# Patient Record
Sex: Male | Born: 1955 | Race: White | Hispanic: No | Marital: Married | Smoking: Never smoker
Health system: Southern US, Community
[De-identification: ages and names within clinical notes are randomized; demographics above are authoritative.]

## PROBLEM LIST (undated history)

## (undated) DIAGNOSIS — S060XAA Concussion with loss of consciousness status unknown, initial encounter: Secondary | ICD-10-CM

## (undated) DIAGNOSIS — E7800 Pure hypercholesterolemia, unspecified: Secondary | ICD-10-CM

## (undated) DIAGNOSIS — I10 Essential (primary) hypertension: Secondary | ICD-10-CM

## (undated) DIAGNOSIS — S060X9A Concussion with loss of consciousness of unspecified duration, initial encounter: Secondary | ICD-10-CM

## (undated) HISTORY — PX: TOOTH EXTRACTION: SUR596

## (undated) HISTORY — PX: TESTICLE TORSION REDUCTION: SHX795

---

## 1999-09-22 ENCOUNTER — Emergency Department (HOSPITAL_COMMUNITY): Admission: EM | Admit: 1999-09-22 | Discharge: 1999-09-22 | Payer: Self-pay

## 2002-09-14 ENCOUNTER — Emergency Department (HOSPITAL_COMMUNITY): Admission: EM | Admit: 2002-09-14 | Discharge: 2002-09-14 | Payer: Self-pay | Admitting: Emergency Medicine

## 2002-09-14 ENCOUNTER — Encounter: Payer: Self-pay | Admitting: Emergency Medicine

## 2002-09-15 ENCOUNTER — Encounter: Payer: Self-pay | Admitting: *Deleted

## 2002-09-15 ENCOUNTER — Emergency Department (HOSPITAL_COMMUNITY): Admission: EM | Admit: 2002-09-15 | Discharge: 2002-09-15 | Payer: Self-pay | Admitting: *Deleted

## 2016-03-07 ENCOUNTER — Encounter (HOSPITAL_COMMUNITY): Payer: Self-pay

## 2016-03-07 ENCOUNTER — Emergency Department (HOSPITAL_COMMUNITY)
Admission: EM | Admit: 2016-03-07 | Discharge: 2016-03-07 | Disposition: A | Discharge status: Home / Self Care | Payer: BLUE CROSS/BLUE SHIELD | Attending: Emergency Medicine | Admitting: Emergency Medicine

## 2016-03-07 ENCOUNTER — Emergency Department (HOSPITAL_COMMUNITY): Payer: BLUE CROSS/BLUE SHIELD

## 2016-03-07 DIAGNOSIS — N289 Disorder of kidney and ureter, unspecified: Secondary | ICD-10-CM | POA: Diagnosis not present

## 2016-03-07 DIAGNOSIS — N132 Hydronephrosis with renal and ureteral calculous obstruction: Secondary | ICD-10-CM | POA: Diagnosis not present

## 2016-03-07 DIAGNOSIS — N23 Unspecified renal colic: Secondary | ICD-10-CM

## 2016-03-07 DIAGNOSIS — Z79899 Other long term (current) drug therapy: Secondary | ICD-10-CM | POA: Insufficient documentation

## 2016-03-07 DIAGNOSIS — I10 Essential (primary) hypertension: Secondary | ICD-10-CM | POA: Diagnosis not present

## 2016-03-07 DIAGNOSIS — R319 Hematuria, unspecified: Secondary | ICD-10-CM | POA: Diagnosis present

## 2016-03-07 HISTORY — DX: Pure hypercholesterolemia, unspecified: E78.00

## 2016-03-07 HISTORY — DX: Essential (primary) hypertension: I10

## 2016-03-07 HISTORY — DX: Concussion with loss of consciousness status unknown, initial encounter: S06.0XAA

## 2016-03-07 HISTORY — DX: Concussion with loss of consciousness of unspecified duration, initial encounter: S06.0X9A

## 2016-03-07 LAB — COMPREHENSIVE METABOLIC PANEL
ALT: 36 U/L (ref 17–63)
ANION GAP: 10 (ref 5–15)
AST: 25 U/L (ref 15–41)
Albumin: 4 g/dL (ref 3.5–5.0)
Alkaline Phosphatase: 55 U/L (ref 38–126)
BUN: 17 mg/dL (ref 6–20)
CHLORIDE: 104 mmol/L (ref 101–111)
CO2: 24 mmol/L (ref 22–32)
Calcium: 9.3 mg/dL (ref 8.9–10.3)
Creatinine, Ser: 1.32 mg/dL — ABNORMAL HIGH (ref 0.61–1.24)
GFR, EST NON AFRICAN AMERICAN: 56 mL/min — AB (ref >60)
Glucose, Bld: 121 mg/dL — ABNORMAL HIGH (ref 65–99)
POTASSIUM: 3.9 mmol/L (ref 3.5–5.1)
SODIUM: 138 mmol/L (ref 135–145)
Total Bilirubin: 0.6 mg/dL (ref 0.3–1.2)
Total Protein: 6.4 g/dL — ABNORMAL LOW (ref 6.5–8.1)

## 2016-03-07 LAB — URINE MICROSCOPIC-ADD ON

## 2016-03-07 LAB — URINALYSIS, ROUTINE W REFLEX MICROSCOPIC
GLUCOSE, UA: NEGATIVE mg/dL
Ketones, ur: 15 mg/dL — AB
Nitrite: NEGATIVE
PROTEIN: 30 mg/dL — AB
SPECIFIC GRAVITY, URINE: 1.019 (ref 1.005–1.030)
pH: 7 (ref 5.0–8.0)

## 2016-03-07 LAB — CBC
HCT: 44.5 % (ref 39.0–52.0)
HEMOGLOBIN: 14.8 g/dL (ref 13.0–17.0)
MCH: 29.4 pg (ref 26.0–34.0)
MCHC: 33.3 g/dL (ref 30.0–36.0)
MCV: 88.5 fL (ref 78.0–100.0)
Platelets: 199 10*3/uL (ref 150–400)
RBC: 5.03 MIL/uL (ref 4.22–5.81)
RDW: 13.2 % (ref 11.5–15.5)
WBC: 8.1 10*3/uL (ref 4.0–10.5)

## 2016-03-07 LAB — POC OCCULT BLOOD, ED: FECAL OCCULT BLD: NEGATIVE

## 2016-03-07 LAB — LIPASE, BLOOD: Lipase: 26 U/L (ref 11–51)

## 2016-03-07 MED ORDER — HYDROMORPHONE HCL 1 MG/ML IJ SOLN
1.0000 mg | Freq: Once | INTRAMUSCULAR | Status: AC
  Administered 2016-03-07: 1 mg via INTRAVENOUS
  Filled 2016-03-07: qty 1

## 2016-03-07 MED ORDER — IOPAMIDOL (ISOVUE-300) INJECTION 61%
INTRAVENOUS | Status: AC
  Filled 2016-03-07: qty 100

## 2016-03-07 MED ORDER — TAMSULOSIN HCL 0.4 MG PO CAPS: 0.4000 mg | ORAL_CAPSULE | Freq: Every day | ORAL | 0 refills | Status: AC

## 2016-03-07 MED ORDER — IBUPROFEN 800 MG PO TABS: 800.0000 mg | ORAL_TABLET | Freq: Three times a day (TID) | ORAL | 0 refills | Status: AC

## 2016-03-07 MED ORDER — IOPAMIDOL (ISOVUE-300) INJECTION 61%
100.0000 mL | Freq: Once | INTRAVENOUS | Status: AC | PRN
  Administered 2016-03-07: 100 mL via INTRAVENOUS

## 2016-03-07 MED ORDER — OXYCODONE-ACETAMINOPHEN 5-325 MG PO TABS: 1.0000 | ORAL_TABLET | Freq: Four times a day (QID) | ORAL | 0 refills | Status: AC | PRN

## 2016-03-07 MED ORDER — KETOROLAC TROMETHAMINE 30 MG/ML IJ SOLN
30.0000 mg | Freq: Once | INTRAMUSCULAR | Status: AC
  Administered 2016-03-07: 30 mg via INTRAVENOUS
  Filled 2016-03-07: qty 1

## 2016-03-07 NOTE — ED Triage Notes (Signed)
Per Pt, Pt reports low left back pain since Sunday with abdominal pain, nausea, and constipation. Today, urine was noted to be blood tinged around 1130. Attempted heating pad, back massage, and home remedies to help with constipation. Pt felt relief from pain, but no bowel movement.

## 2016-03-07 NOTE — ED Notes (Signed)
Patient transported to CT 

## 2016-03-07 NOTE — Discharge Instructions (Signed)
Call [facility] tomorrow to schedule the next available appointment. Strain all urine if you catch a stone save it and given to the urologist. If your pain recurs, take the pain medicine as prescribed. If your pain is not well controlled with the pain medicine prescribed, please [facility].

## 2016-03-07 NOTE — ED Notes (Signed)
MD at bedside. 

## 2016-03-07 NOTE — ED Notes (Signed)
Occult stool card placed at bedside for MD.

## 2016-03-07 NOTE — ED Provider Notes (Signed)
[facility]-EMERGENCY DEPT Provider Note   CSN: [id number] Arrival date & time: 03/07/16  1309  First Provider Contact:  First MD Initiated Contact with Patient 03/07/16 1457        History   Chief Complaint Chief Complaint  Patient presents with  . Hematuria    HPI Neldon NewportLeonard Dauria is a 60 y.o. male.Complains of diffuse crampy abdominal pain onset 3 days ago pain is alleviated with passing gas and with belching. Today he developed left flank pain and hematuria. Nothing makes symptoms better or worse. Presently pain is almost gone. Patient feels constipated. His last good bowel movement was 4 days ago off however he's had several small bowel movements since. No other associated symptoms no treatment prior to coming here. Nothing makes symptoms better or worse. He treated himself with ibuprofen 2 days ago with partial relief  HPI  Past Medical History:  Diagnosis Date  . Concussion   . High cholesterol   . Hypertension     There are no active problems to display for this patient.   Past Surgical History:  Procedure Laterality Date  . TESTICLE TORSION REDUCTION    . TOOTH EXTRACTION         Home Medications    Prior to Admission medications   Medication Sig Start Date End Date Taking? Authorizing Provider  Coenzyme Q10 (CO Q 10 PO) Take 1 tablet by mouth daily.   Yes Historical Provider, MD  lisinopril (PRINIVIL,ZESTRIL) 10 MG tablet Take 10 mg by mouth every evening.    Yes Historical Provider, MD  Multiple Vitamin (MULTI-VITAMIN PO) Take 1 tablet by mouth daily.   Yes Historical Provider, MD  omega-3 acid ethyl esters (LOVAZA) 1 g capsule Take by mouth 2 (two) times daily.   Yes Historical Provider, MD  pravastatin (PRAVACHOL) 40 MG tablet Take 40 mg by mouth every evening.    Yes Historical Provider, MD  TURMERIC PO Take 1 tablet by mouth daily.   Yes Historical Provider, MD    Family History No family history on file.  Social History Social History  Substance Use  Topics  . Smoking status: Never Smoker  . Smokeless tobacco: Never Used  . Alcohol use Yes     Comment: Occasional   Rare alcohol no illicit drug use  Allergies   Review of patient's allergies indicates no known allergies.   Review of Systems Review of Systems  Constitutional: Negative.   HENT: Negative.   Respiratory: Negative.   Cardiovascular: Negative.   Gastrointestinal: Positive for abdominal pain and constipation.  Genitourinary: Positive for hematuria.  Musculoskeletal: Negative.   Skin: Negative.   Neurological: Negative.   Psychiatric/Behavioral: Negative.   All other systems reviewed and are negative.    Physical Exam Updated Vital Signs BP 131/92 (BP Location: Right Arm)   Pulse 90   Temp 98.9 F (37.2 C) (Oral)   Resp 20   Ht 6' (1.829 m)   Wt 275 lb (124.7 kg)   SpO2 97%   BMI 37.30 kg/m   Physical Exam  Constitutional: He appears well-developed and well-nourished.  HENT:  Head: Normocephalic and atraumatic.  Eyes: Conjunctivae are normal. Pupils are equal, round, and reactive to light.  Neck: Neck supple. No tracheal deviation present. No thyromegaly present.  Cardiovascular: Normal rate and regular rhythm.   No murmur heard. Pulmonary/Chest: Effort normal and breath sounds normal.  Abdominal: Soft. Bowel sounds are normal. He exhibits no distension. There is no tenderness.  Obese  Genitourinary: Penis normal.  Genitourinary Comments: Scrotum normal  Musculoskeletal: Normal range of motion. He exhibits no edema or tenderness.  Neurological: He is alert. Coordination normal.  Skin: Skin is warm and dry. No rash noted.  Psychiatric: He has a normal mood and affect.  Nursing note and vitals reviewed.    ED Treatments / Results  Labs (all labs ordered are listed, but only abnormal results are displayed) Labs Reviewed  COMPREHENSIVE METABOLIC PANEL - Abnormal; Notable for the following:       Result Value   Glucose, Bld 121 (*)     Creatinine, Ser 1.32 (*)    Total Protein 6.4 (*)    GFR calc non Af Amer 56 (*)    All other components within normal limits  URINALYSIS, ROUTINE W REFLEX MICROSCOPIC (NOT AT [facility]) - Abnormal; Notable for the following:    Color, Urine RED (*)    APPearance TURBID (*)    Hgb urine dipstick LARGE (*)    Bilirubin Urine MODERATE (*)    Ketones, ur 15 (*)    Protein, ur 30 (*)    Leukocytes, UA SMALL (*)    All other components within normal limits  URINE MICROSCOPIC-ADD ON - Abnormal; Notable for the following:    Squamous Epithelial / LPF 0-5 (*)    Bacteria, UA FEW (*)    All other components within normal limits  LIPASE, BLOOD  CBC  POC OCCULT BLOOD, ED   Results for orders placed or performed during the hospital encounter of 03/07/16  Lipase, blood  Result Value Ref Range   Lipase 26 11 - 51 U/L  Comprehensive metabolic panel  Result Value Ref Range   Sodium 138 135 - 145 mmol/L   Potassium 3.9 3.5 - 5.1 mmol/L   Chloride 104 101 - 111 mmol/L   CO2 24 22 - 32 mmol/L   Glucose, Bld 121 (H) 65 - 99 mg/dL   BUN 17 6 - 20 mg/dL   Creatinine, Ser 1.61 (H) 0.61 - 1.24 mg/dL   Calcium 9.3 8.9 - 09.6 mg/dL   Total Protein 6.4 (L) 6.5 - 8.1 g/dL   Albumin 4.0 3.5 - 5.0 g/dL   AST 25 15 - 41 U/L   ALT 36 17 - 63 U/L   Alkaline Phosphatase 55 38 - 126 U/L   Total Bilirubin 0.6 0.3 - 1.2 mg/dL   GFR calc non Af Amer 56 (L) >60 mL/min   GFR calc Af Amer >60 >60 mL/min   Anion gap 10 5 - 15  CBC  Result Value Ref Range   WBC 8.1 4.0 - 10.5 K/uL   RBC 5.03 4.22 - 5.81 MIL/uL   Hemoglobin 14.8 13.0 - 17.0 g/dL   HCT 04.5 40.9 - 81.1 %   MCV 88.5 78.0 - 100.0 fL   MCH 29.4 26.0 - 34.0 pg   MCHC 33.3 30.0 - 36.0 g/dL   RDW 91.4 78.2 - 95.6 %   Platelets 199 150 - 400 K/uL  Urinalysis, Routine w reflex microscopic  Result Value Ref Range   Color, Urine RED (A) YELLOW   APPearance TURBID (A) CLEAR   Specific Gravity, Urine 1.019 1.005 - 1.030   pH 7.0 5.0 - 8.0   Glucose,  UA NEGATIVE NEGATIVE mg/dL   Hgb urine dipstick LARGE (A) NEGATIVE   Bilirubin Urine MODERATE (A) NEGATIVE   Ketones, ur 15 (A) NEGATIVE mg/dL   Protein, ur 30 (A) NEGATIVE mg/dL   Nitrite NEGATIVE NEGATIVE   Leukocytes, UA  SMALL (A) NEGATIVE  Urine microscopic-add on  Result Value Ref Range   Squamous Epithelial / LPF 0-5 (A) NONE SEEN   WBC, UA 0-5 0 - 5 WBC/hpf   RBC / HPF TOO NUMEROUS TO COUNT 0 - 5 RBC/hpf   Bacteria, UA FEW (A) NONE SEEN  POC occult blood, ED Provider will collect  Result Value Ref Range   Fecal Occult Bld NEGATIVE NEGATIVE   Ct Abdomen Pelvis W Contrast  Result Date: 03/07/2016 CLINICAL DATA:  Left lower back pain for 3 days. Nausea and constipation. Blood in the urine. History of testicular surgery as a child. EXAM: CT ABDOMEN AND PELVIS WITH CONTRAST TECHNIQUE: Multidetector CT imaging of the abdomen and pelvis was performed using the standard protocol following bolus administration of intravenous contrast. CONTRAST:  ISOVUE-300 IOPAMIDOL (ISOVUE-300) INJECTION 61% COMPARISON:  None. FINDINGS: Lower chest: No pulmonary nodules, pleural effusions, or infiltrates. Heart size is normal. No imaged pericardial effusion or significant coronary artery calcifications. Hepatobiliary: The liver is diffusely low attenuation without focal lesions. Gallbladder is present and normal in CT appearance. Pancreas: Normal noncontrast appearance. Spleen: Normal noncontrast appearance. Left upper quadrant splenule. Renal/Adrenal: The adrenal glands are normal. Left hydronephrosis secondary to a calculus measuring 4 mm at the level of L3. Delayed left enhancement and excretion. The right kidney is normal in appearance. No focal renal mass on the right. The right ureter is normal in appearance. Gastrointestinal tract: The stomach and small bowel loops appear normal. The appendix is well seen and has a normal appearance. There are colonic diverticula but no associated inflammation.  Reproductive/Pelvis: The prostate, seminal vesicles, and bladder are normal in appearance. No free pelvic fluid. Vascular/Lymphatic: There is moderate atherosclerosis of the abdominal aorta. No aneurysm. No retroperitoneal or mesenteric adenopathy. Musculoskeletal/Abdominal wall: Abdominal wall is unremarkable. The there is 13 mm anterolisthesis L5 on S1 associated with significant disc height loss, vacuum disc phenomenon, and disc bulge at same level. Bilateral pars defects are identified at L5. No suspicious lytic or blastic lesions are identified. Other: none IMPRESSION: 1. Obstructing the left proximal ureteral stone measuring 4 mm at the level of L3, associated with moderate to significant left-sided hydronephrosis and delayed renal enhancement. 2. Hepatic steatosis. 3. Normal appendix. 4. Grade 1 anterolisthesis L5 on S1 associated with bilateral pars defects at L5 and disc bulge at L5-S1. Electronically Signed   By: Norva Pavlov M.D.   On: 03/07/2016 16:30   EKG  EKG Interpretation None       Radiology No results found.  Procedures Procedures (including critical care time)  Medications Ordered in ED Medications - No data to display   Initial Impression / Assessment and Plan / ED Course  I have reviewed the triage vital signs and the nursing notes.  Pertinent labs & imaging results that were available during my care of the patient were reviewed by me and considered in my medical decision making (see chart for details).  Clinical Course   1615 p.m. patient complains of severe left flank pain. Intravenous hydromorphone ordered. 1740 p.m. continues to complain of severe left flank pain. Intravenous Toradol ordered. At 7:30 PM pain is well-controlled and patient feels ready to go home   Final Clinical Impressions(s) / ED Diagnoses  Plan prescriptions Motrin, Percocet, Flomax, urine strainer. Referral [facility]. Final diagnoses:  None   Diagnosis #1ureteral colic #2  renal insufficiency New Prescriptions New Prescriptions   No medications on file     Doug Sou, MD 03/07/16 1932

## 2017-09-06 IMAGING — CT CT ABD-PELV W/ CM
2 of 5 series · 9 of 46 positions shown, 10 images · IV contrast (Iodine)
Comparison: None.

CLINICAL DATA: Left lower back pain for 3 days. Nausea and
constipation. Blood in the urine. History of testicular surgery as a
child.

EXAM:
CT ABDOMEN AND PELVIS WITH CONTRAST
TECHNIQUE: Multidetector CT imaging of the abdomen and pelvis was performed
using the standard protocol following bolus administration of
intravenous contrast.
CONTRAST:  100mL [id number]-JKK IOPAMIDOL ([id number]-JKK) INJECTION 61%

[Series 201: routine, idose (2) · axial · 0.88mm/px · z∈[-373,-13]mm · 6 of 92 slices shown, 7 images]
[im 10/92  soft-tissue]
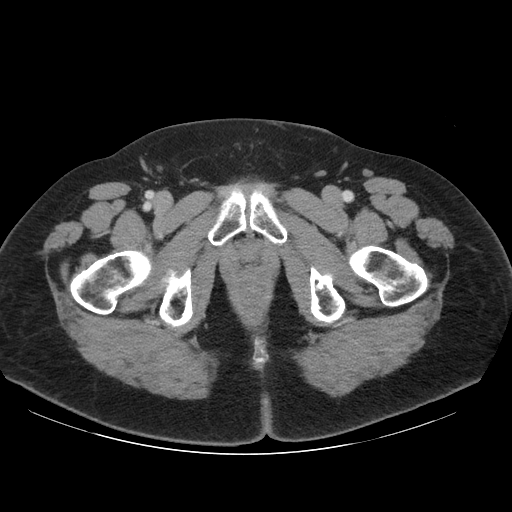
[im 10/92  bone]
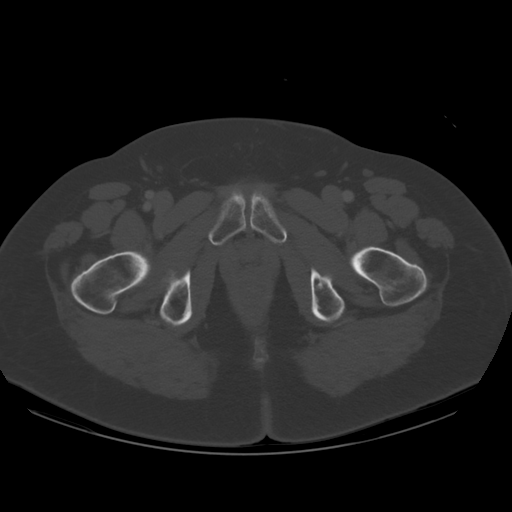
[im 24/92  soft-tissue]
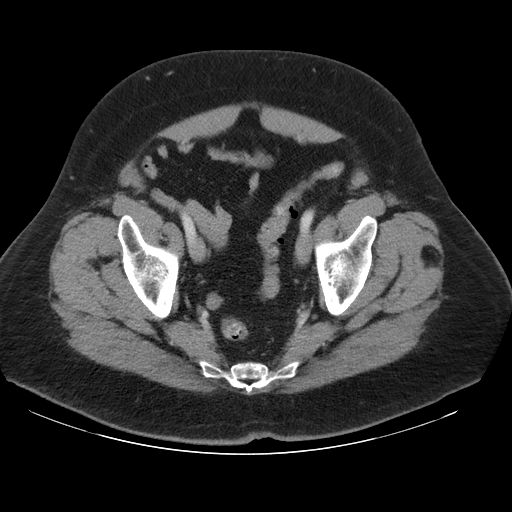
[im 39/92  soft-tissue]
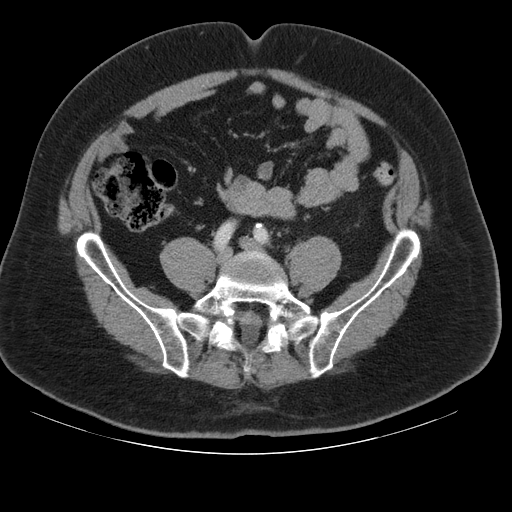
[im 53/92  soft-tissue]
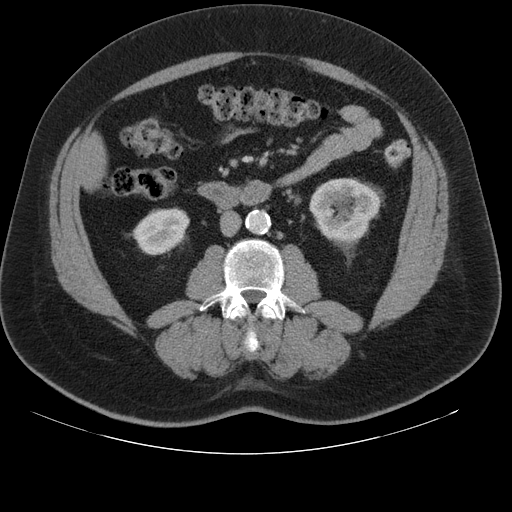
[im 68/92  soft-tissue]
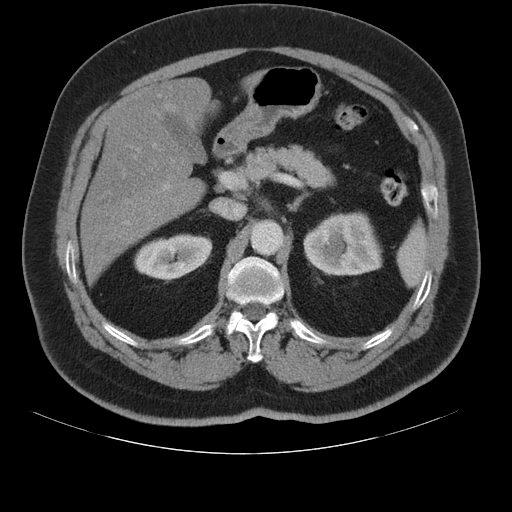
[im 82/92  soft-tissue]
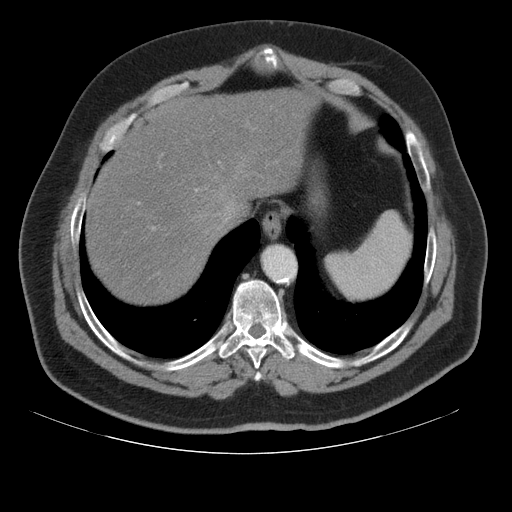

[Series 203: coronals, idose (2) · coronal · 0.45mm/px · 3 of 147 slices shown]
[im 49/147  soft-tissue]
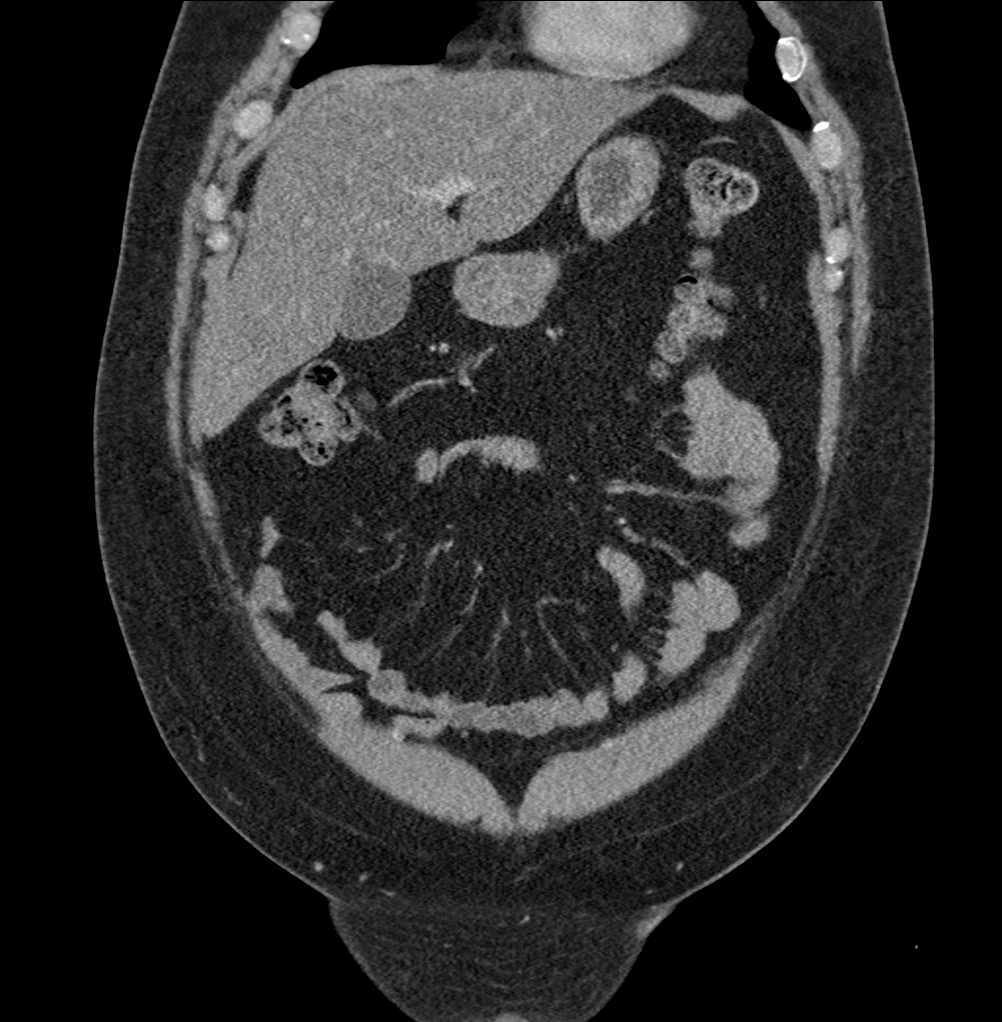
[im 65/147  soft-tissue]
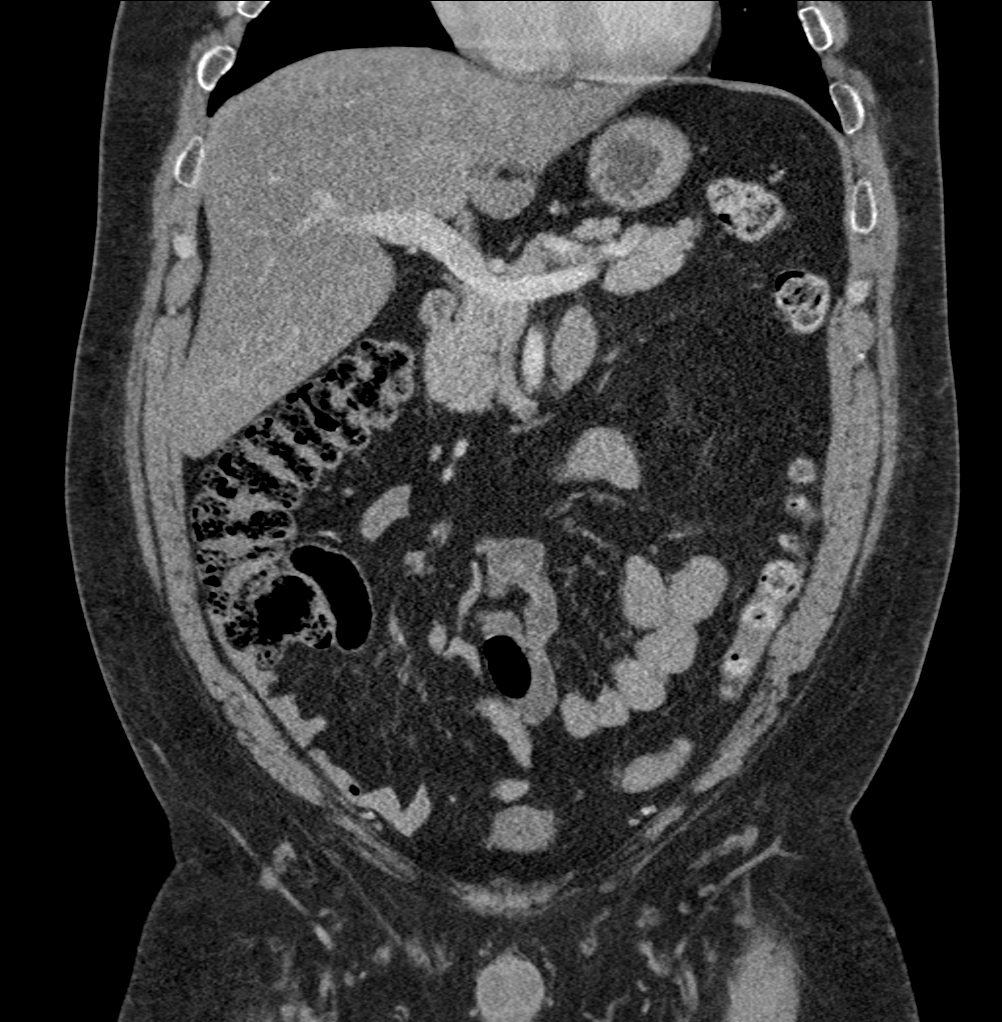
[im 82/147  soft-tissue]
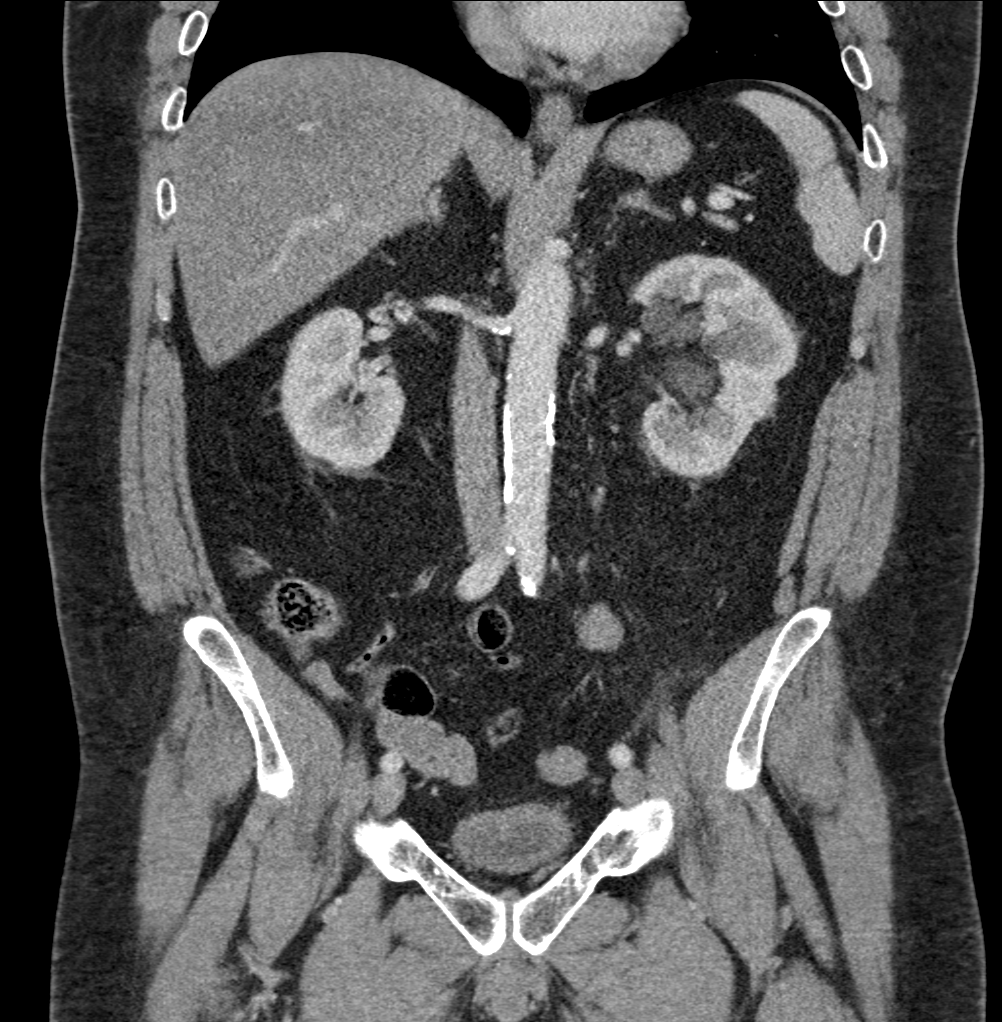

[9 of 46 positions shown; findings below may reference images not displayed]

FINDINGS: Lower chest: No pulmonary nodules, pleural effusions, or
infiltrates. Heart size is normal. No imaged pericardial effusion or
significant coronary artery calcifications.

Hepatobiliary: The liver is diffusely low attenuation without focal
lesions. Gallbladder is present and normal in CT appearance.

Pancreas: Normal noncontrast appearance.

Spleen: Normal noncontrast appearance. Left upper quadrant splenule.

Renal/Adrenal: The adrenal glands are normal.

Left hydronephrosis secondary to a calculus measuring 4 mm at the
level of L3. Delayed left enhancement and excretion. The right
kidney is normal in appearance. No focal renal mass on the right.
The right ureter is normal in appearance.

Gastrointestinal tract: The stomach and small bowel loops appear
normal. The appendix is well seen and has a normal appearance. There
are colonic diverticula but no associated inflammation.

Reproductive/Pelvis: The prostate, seminal vesicles, and bladder are
normal in appearance. No free pelvic fluid.

Vascular/Lymphatic: There is moderate atherosclerosis of the
abdominal aorta. No aneurysm. No retroperitoneal or mesenteric
adenopathy.

Musculoskeletal/Abdominal wall: Abdominal wall is unremarkable. The
there is 13 mm anterolisthesis L5 on S1 associated with significant
disc height loss, vacuum disc phenomenon, and disc bulge at same
level. Bilateral pars defects are identified at L5. No suspicious
lytic or blastic lesions are identified.

Other: none
IMPRESSION: 1. Obstructing the left proximal ureteral stone measuring 4 mm at
the level of L3, associated with moderate to significant left-sided
hydronephrosis and delayed renal enhancement.
2. Hepatic steatosis.
3. Normal appendix.
4. Grade 1 anterolisthesis L5 on S1 associated with bilateral pars
defects at L5 and disc bulge at L5-S1.
